# Patient Record
Sex: Male | Born: 1995 | Hispanic: No | Marital: Married | Smoking: Light tobacco smoker
Health system: Southern US, Community
[De-identification: ages and names within clinical notes are randomized; demographics above are authoritative.]

## PROBLEM LIST (undated history)

## (undated) DIAGNOSIS — H547 Unspecified visual loss: Secondary | ICD-10-CM

## (undated) HISTORY — DX: Unspecified visual loss: H54.7

## (undated) HISTORY — PX: APPENDECTOMY (OPEN): SHX54

## (undated) HISTORY — PX: APPENDECTOMY: SHX54

---

## 2014-08-24 ENCOUNTER — Emergency Department: Payer: Self-pay

## 2014-08-24 ENCOUNTER — Emergency Department
Admission: EM | Admit: 2014-08-24 | Discharge: 2014-08-24 | Disposition: A | Discharge status: Home / Self Care | Payer: Self-pay | Attending: Emergency Medicine | Admitting: Emergency Medicine

## 2014-08-24 DIAGNOSIS — Y92410 Unspecified street and highway as the place of occurrence of the external cause: Secondary | ICD-10-CM | POA: Insufficient documentation

## 2014-08-24 DIAGNOSIS — S61412A Laceration without foreign body of left hand, initial encounter: Secondary | ICD-10-CM | POA: Insufficient documentation

## 2014-08-24 DIAGNOSIS — S62341A Nondisplaced fracture of base of second metacarpal bone. left hand, initial encounter for closed fracture: Secondary | ICD-10-CM | POA: Insufficient documentation

## 2014-08-24 MED ORDER — HYDROCODONE-ACETAMINOPHEN 5-325 MG PO TABS
1.0000 | ORAL_TABLET | Freq: Four times a day (QID) | ORAL | Status: DC | PRN
Start: 2014-08-24 — End: 2018-02-11
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
## 2014-08-24 NOTE — Discharge Instructions (Signed)
Dear Terry Fitzpatrick,    You were seen today by Weyman Rodney, PA-C. Thank you for choosing the [facility] for your healthcare needs.  We hope your visit today was EXCELLENT.    Rest and ice your injury.  Keep splint in place.  Take ibuprofen (over the counter) as needed for pain.  For more severe pain you can take Norco.  Do not take when working or driving.  Follow-up with Dr. Lorin Picket (orthopedist) tomorrow or Monday.  Return to the emergency department for any new or worsening symptoms.    Please take any medications prescribed as directed.     If you have any questions or concerns, I am available at [phone]. Please do not hesitate to contact me if I can be of assistance.     Below is some information and resources that our patients often find helpful.    Sincerely,    Weyman Rodney, Physician Assistant  [facility]    ________________________________________________________________  Thank you for choosing [facility] for your emergency care needs.  We strive to provide EXCELLENT care to you and your family.      If you do not continue to improve or your condition worsens, please contact your doctor or return immediately to the Emergency Department.    DOCTOR REFERRALS  Call [phone] (available 24 hours a day, 7 days a week) if you need any further referrals and we can help you find a primary care doctor or specialist.  Also, available online at:  [email or web address]    YOUR CONTACT INFORMATION  Before leaving please check with registration to make sure we have an up-to-date contact number.  You can call registration at [phone] to update your information.  For questions about your hospital bill, please call [phone].  For questions about your Emergency Dept Physician bill please call [phone].      FREE HEALTH SERVICES  If you need help with health or social services, please call 2-1-1 for a  free referral to resources in your area.  2-1-1 is a free service connecting people with information on health insurance, free clinics, pregnancy, mental health, dental care, food assistance, housing, and substance abuse counseling.  Also, available online at:  http://www.[facility].org    MEDICAL RECORDS AND TESTS  Certain laboratory test results do not come back the same day, for example urine cultures.  We will contact you if other important findings are noted. Radiology films are often reviewed again to ensure accuracy.  If there is any discrepancy, we will notify you.    Please call [phone] to pick up a complimentary CD of any radiology studies performed.  If you or your doctor would like to request a copy of your medical records, please call [phone].      ORTHOPEDIC INJURY   Please know that significant injuries can exist even when an initial x-ray is read as normal or negative.  This can occur because some fractures (broken bones) are not initially visible on x-rays.  For this reason, close outpatient follow-up with your primary care doctor or bone specialist (orthopedist) is required.    MEDICATIONS AND FOLLOWUP  Please be aware that some prescription medications can cause drowsiness.  Use caution when driving or operating machinery.    The examination and treatment you have received in our Emergency Department is provided on an emergency basis, and is not intended to be a substitute for your  primary care physician.  It is important that your doctor checks you again and that you report any new or remaining problems at that time.      [place], [place], [place] [address] (1.4 miles, 7 minutes)  [place], [place], [place] [address] (6.5 miles, 13 minutes)  Handout with directions available on request.

## 2021-04-01 ENCOUNTER — Other Ambulatory Visit: Payer: Self-pay

## 2021-04-01 ENCOUNTER — Emergency Department (INDEPENDENT_AMBULATORY_CARE_PROVIDER_SITE_OTHER): Payer: 59

## 2021-04-01 ENCOUNTER — Emergency Department
Admission: EM | Admit: 2021-04-01 | Discharge: 2021-04-01 | Disposition: A | Discharge status: Home / Self Care | Payer: 59 | Source: Home / Self Care

## 2021-04-01 DIAGNOSIS — M25512 Pain in left shoulder: Secondary | ICD-10-CM

## 2021-04-01 DIAGNOSIS — M62838 Other muscle spasm: Secondary | ICD-10-CM

## 2021-04-01 DIAGNOSIS — S46912A Strain of unspecified muscle, fascia and tendon at shoulder and upper arm level, left arm, initial encounter: Secondary | ICD-10-CM

## 2021-04-01 MED ORDER — METHYLPREDNISOLONE 4 MG PO TBPK: ORAL_TABLET | ORAL | 0 refills | Status: AC

## 2021-04-01 MED ORDER — BACLOFEN 10 MG PO TABS: 10.0000 mg | ORAL_TABLET | Freq: Three times a day (TID) | ORAL | 0 refills | Status: AC

## 2021-04-01 MED ORDER — METHYLPREDNISOLONE ACETATE 80 MG/ML IJ SUSP
80.0000 mg | Freq: Once | INTRAMUSCULAR | Status: AC
  Administered 2021-04-01: 80 mg via INTRAMUSCULAR

## 2021-04-01 NOTE — ED Provider Notes (Signed)
[facility]    CSN: [id number] Arrival date & time: 04/01/21  1048      History   Chief Complaint Chief Complaint  Patient presents with  . Shoulder Pain    HPI Ronald Williamson is a 25 y.o. male.   HPI 25 year old male presents with left shoulder pain for 1 month.  Patient reports he typically drives with left arm is noticed increased pain switch to right arm.  Denies injury or insult to left shoulder.  History reviewed. No pertinent past medical history.  There are no problems to display for this patient.   Past Surgical History:  Procedure Laterality Date  . APPENDECTOMY         Home Medications    Prior to Admission medications   Medication Sig Start Date End Date Taking? Authorizing Provider  baclofen (LIORESAL) 10 MG tablet Take 1 tablet (10 mg total) by mouth 3 (three) times daily. 04/01/21  Yes Trevor Iha, FNP  methylPREDNISolone (MEDROL DOSEPAK) 4 MG TBPK tablet Take as directed 04/01/21  Yes Trevor Iha, FNP    Family History Family History  Problem Relation Age of Onset  . Healthy Mother   . Healthy Father     Social History Social History   Tobacco Use  . Smoking status: Light Tobacco Smoker    Types: Pipe, Cigars  . Smokeless tobacco: Never Used  Vaping Use  . Vaping Use: Some days  . Substances: Nicotine  Substance Use Topics  . Alcohol use: Yes    Comment: occ  . Drug use: Yes     Allergies   Patient has no allergy information on record.   Review of Systems Review of Systems  Constitutional: Negative.   HENT: Negative.   Eyes: Negative.   Respiratory: Negative.   Cardiovascular: Negative.   Gastrointestinal: Negative.   Genitourinary: Negative.   Musculoskeletal:       Shoulder pain x 1 month  Skin: Negative.   Neurological: Negative.      Physical Exam Triage Vital Signs ED Triage Vitals  Enc Vitals Group     BP 04/01/21 1105 132/88     Pulse Rate 04/01/21 1105 61     Resp 04/01/21 1105 15      Temp 04/01/21 1105 98 F (36.7 C)     Temp Source 04/01/21 1105 Oral     SpO2 04/01/21 1105 98 %     Weight 04/01/21 1102 136 lb 11 oz (62 kg)     Height 04/01/21 1102 5\' 5"  (1.651 m)     Head Circumference --      Peak Flow --      Pain Score 04/01/21 1102 9     Pain Loc --      Pain Edu? --      Excl. in GC? --    No data found.  Updated Vital Signs BP 132/88 (BP Location: Right Arm)   Pulse 61   Temp 98 F (36.7 C) (Oral)   Resp 15   Ht 5\' 5"  (1.651 m)   Wt 136 lb 11 oz (62 kg)   SpO2 98%   BMI 22.75 kg/m     Physical Exam Vitals and nursing note reviewed.  Constitutional:      General: He is not in acute distress.    Appearance: Normal appearance. He is normal weight. He is not ill-appearing.  HENT:     Head: Normocephalic and atraumatic.     Mouth/Throat:     Mouth: Mucous  membranes are moist.     Pharynx: Oropharynx is clear.  Eyes:     Extraocular Movements: Extraocular movements intact.     Conjunctiva/sclera: Conjunctivae normal.     Pupils: Pupils are equal, round, and reactive to light.  Cardiovascular:     Rate and Rhythm: Normal rate and regular rhythm.     Pulses: Normal pulses.     Heart sounds: Normal heart sounds.  Pulmonary:     Effort: Pulmonary effort is normal.     Breath sounds: Normal breath sounds.     Comments: No adventitious breath sounds noted Musculoskeletal:     Cervical back: Normal range of motion and neck supple. No tenderness.     Comments: Left shoulder (anterior/medical aspects): Non-TTP over GH and AC joints, limited range of motion with flexion/extension, scapular retraction (with mild pain elicited), internal/external rotation, and horizontal abduction/abduction (with mild pain elicited), no deformity noted  Lymphadenopathy:     Cervical: No cervical adenopathy.  Skin:    General: Skin is warm and dry.  Neurological:     General: No focal deficit present.     Mental Status: He is alert and oriented to person, place,  and time.  Psychiatric:        Mood and Affect: Mood normal.        Behavior: Behavior normal.      UC Treatments / Results  Labs (all labs ordered are listed, but only abnormal results are displayed) Labs Reviewed - No data to display  EKG   Radiology DG Shoulder Left  Result Date: 04/01/2021 CLINICAL DATA:  Left shoulder pain for 1 month.  No known injury. EXAM: LEFT SHOULDER - 2+ VIEW COMPARISON:  None. FINDINGS: There is no evidence of fracture or dislocation. There is no evidence of arthropathy or other focal bone abnormality. Soft tissues are unremarkable. IMPRESSION: Normal exam. Electronically Signed   By: Drusilla Kanner M.D.   On: 04/01/2021 12:35    Procedures Procedures (including critical care time)  Medications Ordered in UC Medications  methylPREDNISolone acetate (DEPO-MEDROL) injection 80 mg (80 mg Intramuscular Given 04/01/21 1214)    Initial Impression / Assessment and Plan / UC Course  I have reviewed the triage vital signs and the nursing notes.  Pertinent labs & imaging results that were available during my care of the patient were reviewed by me and considered in my medical decision making (see chart for details).    MDM: 1.  Left shoulder pain, 2.  Left shoulder strain, 3.  Left Trapezius spasm. Final Clinical Impressions(s) / UC Diagnoses   Final diagnoses:  Strain of left shoulder, initial encounter  Acute pain of left shoulder  Trapezius muscle spasm     Discharge Instructions     Advised/instructed patient to take medication as directed with food to completion.  Encourage patient increase daily water intake while taking this medication.  Encourage patient to avoid repetitive overuse activities involving affected area of left shoulder and left-sided posterior neck.  Advised patient may use Baclofen daily, or as needed    ED Prescriptions    Medication Sig Dispense Auth. Provider   methylPREDNISolone (MEDROL DOSEPAK) 4 MG TBPK tablet Take  as directed 1 each Trevor Iha, FNP   baclofen (LIORESAL) 10 MG tablet Take 1 tablet (10 mg total) by mouth 3 (three) times daily. 30 each Trevor Iha, FNP     PDMP not reviewed this encounter.   Trevor Iha, FNP 04/01/21 1313

## 2021-04-01 NOTE — Discharge Instructions (Addendum)
Advised/instructed patient to take medication as directed with food to completion.  Encourage patient increase daily water intake while taking this medication.  Encourage patient to avoid repetitive overuse activities involving affected area of left shoulder and left-sided posterior neck.  Advised patient may use Baclofen daily, or as needed

## 2021-04-01 NOTE — ED Triage Notes (Signed)
Pt presents with L shoulder pain for 1 month. Pt st he typically drives with his L arm and noticed increased pain so has switched to his Right arm. Pt st he has been noticing pain when performing ADLS pt takes otc pain medication with some relief.  Pt declines hurting it.

## 2021-04-08 ENCOUNTER — Ambulatory Visit (INDEPENDENT_AMBULATORY_CARE_PROVIDER_SITE_OTHER): Payer: 59 | Admitting: Sports Medicine

## 2021-04-08 ENCOUNTER — Other Ambulatory Visit: Payer: Self-pay

## 2021-04-08 DIAGNOSIS — M7542 Impingement syndrome of left shoulder: Secondary | ICD-10-CM | POA: Diagnosis not present

## 2021-04-08 NOTE — Assessment & Plan Note (Signed)
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 For the past month he had pain over his deltoid, worse with overhead activities. Terrible in the mornings. He was seen in urgent care, referred to me for further evaluation. On exam he only has minimal impingement signs, good strength, good motion. I think we can treat him conservatively, continue NSAIDs, adding aggressive formal physical therapy, return to see me in 4 to 6 weeks, subacromial injection if no better.

## 2021-04-08 NOTE — Progress Notes (Signed)
    Procedures performed today:    None.  Independent interpretation of notes and tests performed by another provider:   X-rays personally reviewed and are unremarkable.  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 For the past month he had pain over his deltoid, worse with overhead activities. Terrible in the mornings. He was seen in urgent care, referred to me for further evaluation. On exam he only has minimal impingement signs, good strength, good motion. I think we can treat him conservatively, continue NSAIDs, adding aggressive formal physical therapy, return to see me in 4 to 6 weeks, subacromial injection if no better.    ___________________________________________ Ihor Austin. Benjamin Stain, M.D., ABFM., CAQSM. Primary Care and Sports Medicine [facility]  Adjunct Instructor of Family Medicine  [employer]

## 2021-04-09 ENCOUNTER — Encounter: Payer: Self-pay | Admitting: Physical Therapy

## 2021-04-09 ENCOUNTER — Ambulatory Visit (INDEPENDENT_AMBULATORY_CARE_PROVIDER_SITE_OTHER): Payer: 59 | Admitting: Physical Therapy

## 2021-04-09 DIAGNOSIS — R293 Abnormal posture: Secondary | ICD-10-CM

## 2021-04-09 DIAGNOSIS — M6281 Muscle weakness (generalized): Secondary | ICD-10-CM | POA: Diagnosis not present

## 2021-04-09 DIAGNOSIS — M25512 Pain in left shoulder: Secondary | ICD-10-CM | POA: Diagnosis not present

## 2021-04-09 NOTE — Patient Instructions (Signed)
Access Code: [email or web address] URL: [email or web address] Date: 04/09/2021 Prepared by: Reggy Eye  Exercises Doorway Pec Stretch at 90 Degrees Abduction - 1 x daily - 7 x weekly - 3 sets - 1 reps - 20-30 seconds hold Supine Chest Stretch with Elbows Bent - 1 x daily - 7 x weekly - 3 sets - 1 reps - 20-30 seconds hold Supine Chest Stretch on Foam Roll - 1 x daily - 7 x weekly - 3 sets - 1 reps - 20-30 seconds hold Standing Serratus Punch with Resistance - 1 x daily - 7 x weekly - 3 sets - 10 reps Standing Bilateral Low Shoulder Row with Anchored Resistance - 1 x daily - 7 x weekly - 3 sets - 10 reps Shoulder extension with resistance - Neutral - 1 x daily - 7 x weekly - 3 sets - 10 reps Shoulder External Rotation and Scapular Retraction with Resistance - 1 x daily - 7 x weekly - 3 sets - 10 reps Scaption with Dumbbells - 1 x daily - 7 x weekly - 3 sets - 10 reps

## 2021-04-09 NOTE — Therapy (Signed)
[facility] [place] [address] [place], [place], [address] Phone: [phone]   Fax:  [phone]  Physical Therapy Evaluation  Patient Details  Name: Ronald Williamson MRN: [id number] Date of Birth: [birth year] Referring Provider (PT): [email or web address]   Encounter Date: 04/09/2021   PT End of Session - 04/09/21 0838     Visit Number 1    Number of Visits 1    Date for PT Re-Evaluation 04/09/21    PT Start Time 0800    PT Stop Time 0834    PT Time Calculation (min) 34 min    Activity Tolerance Patient tolerated treatment well    Behavior During Therapy [facility] for tasks assessed/performed             History reviewed. No pertinent past medical history.  Past Surgical History:  Procedure Laterality Date   APPENDECTOMY      There were no vitals filed for this visit.    Subjective Assessment - 04/09/21 0804     Subjective Pt states he has had Lt shoulder pain x 1 month, insiduous onset. Pt states his pain is worse in the morning and is helped by meds. Pain is not constant but is there "most of the time". Pt is right handed but usually uses his Lt hand to drive, pt has switched to using his right hand.    Patient Stated Goals decrease pain    Currently in Pain? Yes    Pain Score 2     Pain Location Shoulder    Pain Orientation Left    Pain Descriptors / Indicators Aching    Pain Type Acute pain    Pain Radiating Towards elbow    Pain Onset More than a month ago    Pain Frequency Intermittent    Aggravating Factors  unknown    Pain Relieving Factors meds                [facility] PT Assessment - 04/09/21 0001       Assessment   Medical Diagnosis impingement syndrome left shoulder    Referring Provider (PT) [email or web address]    Hand Dominance Right      Balance Screen   Has the patient fallen in the past 6 months No      Prior Function   Level of Independence Independent      Posture/Postural Control   Posture  Comments rounded shoulders      ROM / Strength   AROM / PROM / Strength Strength;AROM      AROM   Overall AROM Comments bilat shoulder WFL      Strength   Overall Strength Comments bilat shoulder 5/5 flexion and abduction, Lt scaption 3+/5, pain with resisted IR Lt 5/5      Palpation   Palpation comment shoulder jt mobility WFL, TTP Lt pec major and anterior deltoid      Special Tests    Special Tests Laxity/Instability Tests;Biceps/Labral Tests    Other special tests empty can negative left    Biceps/Labral tests Speeds Test      Speeds test   findings Negative    Side Left                        Objective measurements completed on examination: See above findings.       [facility] Adult PT Treatment/Exercise - 04/09/21 0001       Exercises   Exercises Shoulder  Shoulder Exercises: Standing   Extension 20 reps    Theraband Level (Shoulder Extension) Level 4 (Blue)    Row 20 reps    Theraband Level (Shoulder Row) Level 4 (Blue)    Other Standing Exercises Ws blue TB x 10, serratus punch blue TB x 10      Shoulder Exercises: Stretch   Star Gazer Stretch 2 reps;20 seconds    Other Shoulder Stretches doorway stretch all 3 positions x 20 sec    Other Shoulder Stretches supine lying over foam roll x 30 sec                    PT Education - 04/09/21 0838     Education Details PT POC, HEP    Person(s) Educated Patient    Methods Explanation;Demonstration;Handout    Comprehension Verbalized understanding;Returned demonstration                         Plan - 04/09/21 0839     Clinical Impression Statement Pt is a 25 y/o male who presents with c/o Lt shoulder pain of insiduous onset. Pt presents with impaired posture, strength and flexibility and was given HEP to address deficits. Pt with good understanding and performance of HEP. No further PT needs identified at this time.    Personal Factors and Comorbidities Time since onset  of injury/illness/exacerbation    Examination-Participation Restrictions Driving    Stability/Clinical Decision Making Stable/Uncomplicated    Rehab Potential Good    PT Frequency One time visit    PT Treatment/Interventions Therapeutic exercise    PT Next Visit Plan eval only    PT Home Exercise Plan [id number]    Consulted and Agree with Plan of Care Patient             Patient will benefit from skilled therapeutic intervention in order to improve the following deficits and impairments:  Pain, Postural dysfunction, Impaired flexibility, Decreased strength  Visit Diagnosis: Muscle weakness (generalized) - Plan: PT plan of care cert/re-cert  Abnormal posture - Plan: PT plan of care cert/re-cert  Acute pain of left shoulder - Plan: PT plan of care cert/re-cert     Problem List Patient Active Problem List   Diagnosis Date Noted   Impingement syndrome, shoulder, left 04/08/2021   Ronald Williamson, PT  Ronald Williamson 04/09/2021, 8:44 AM  [facility] [address] [place], [place], [address] Phone: [phone]   Fax:  [phone]  Name: Ronald Williamson MRN: [id number] Date of Birth: 11/01/1995

## 2021-05-06 ENCOUNTER — Ambulatory Visit: Payer: 59 | Admitting: Sports Medicine

## 2022-02-03 IMAGING — DX DG SHOULDER 2+V*L*
3 series · 3 of 3 positions shown · non-contrast
Comparison: None.

CLINICAL DATA: Left shoulder pain for 1 month.  No known injury.

EXAM:
LEFT SHOULDER - 2+ VIEW

[shoulder grashey]
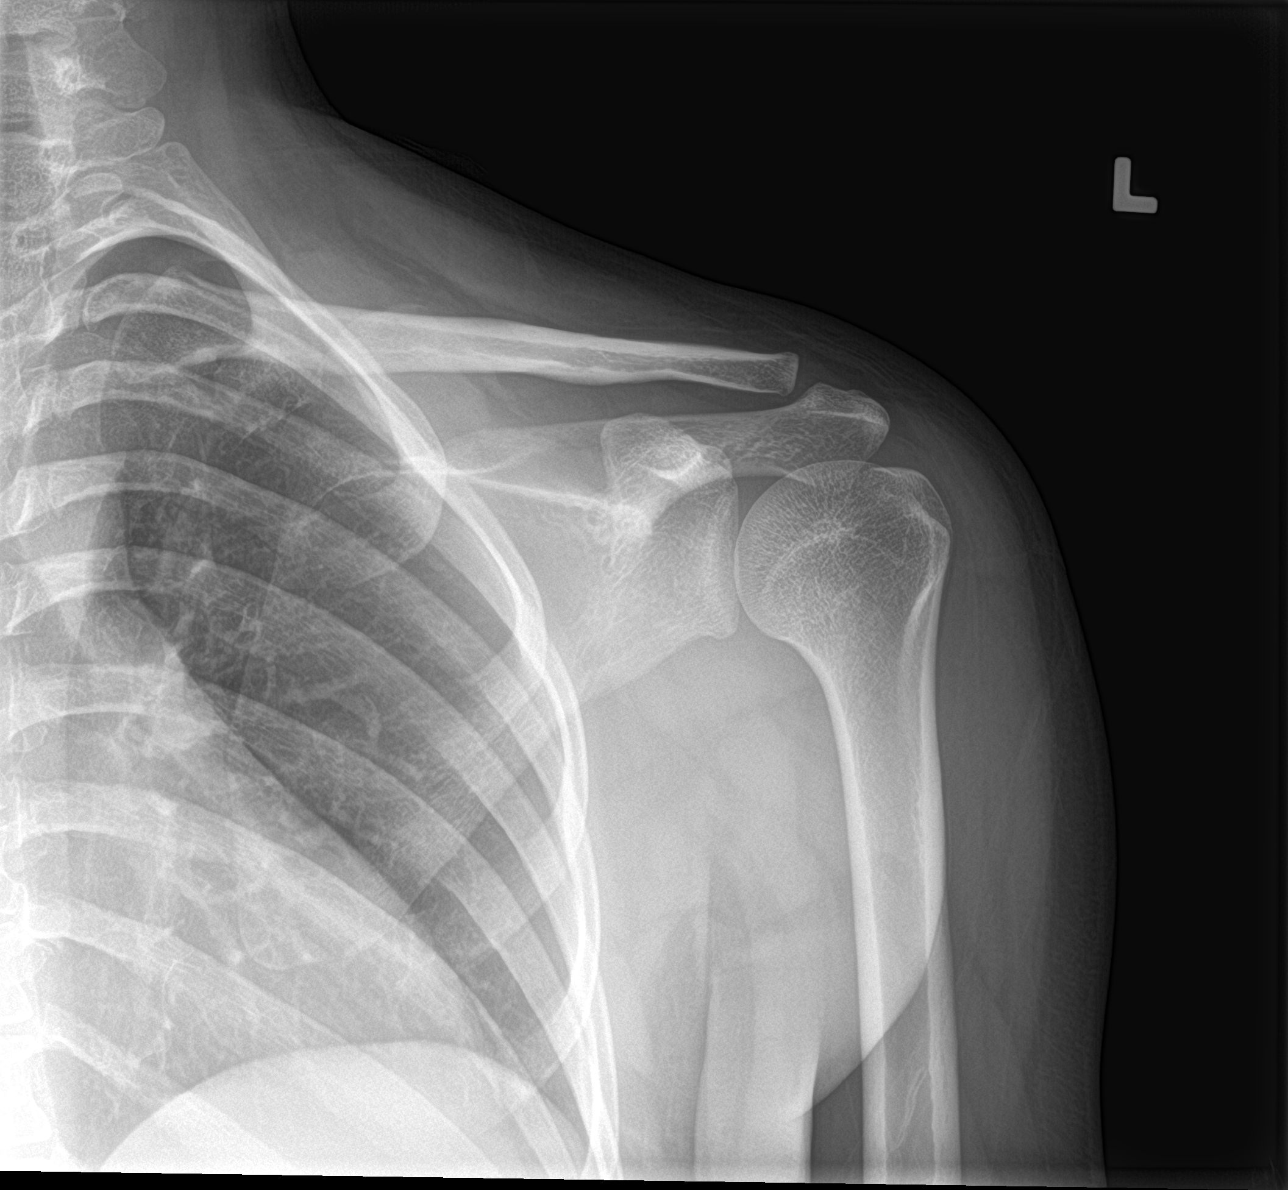

[shoulder y view]
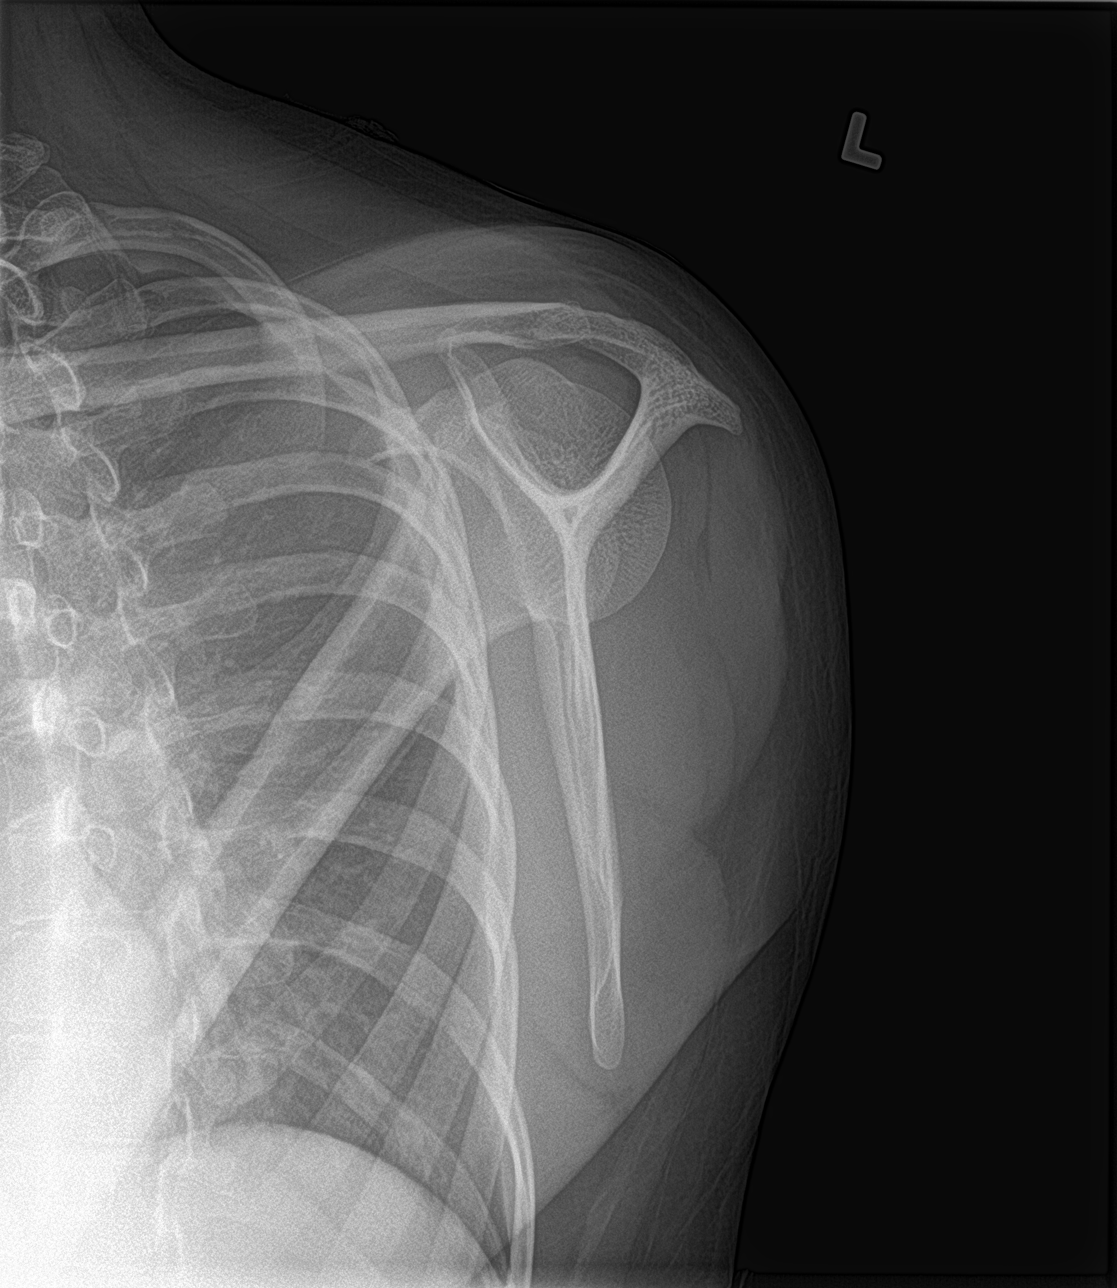

[shoulder axillary]
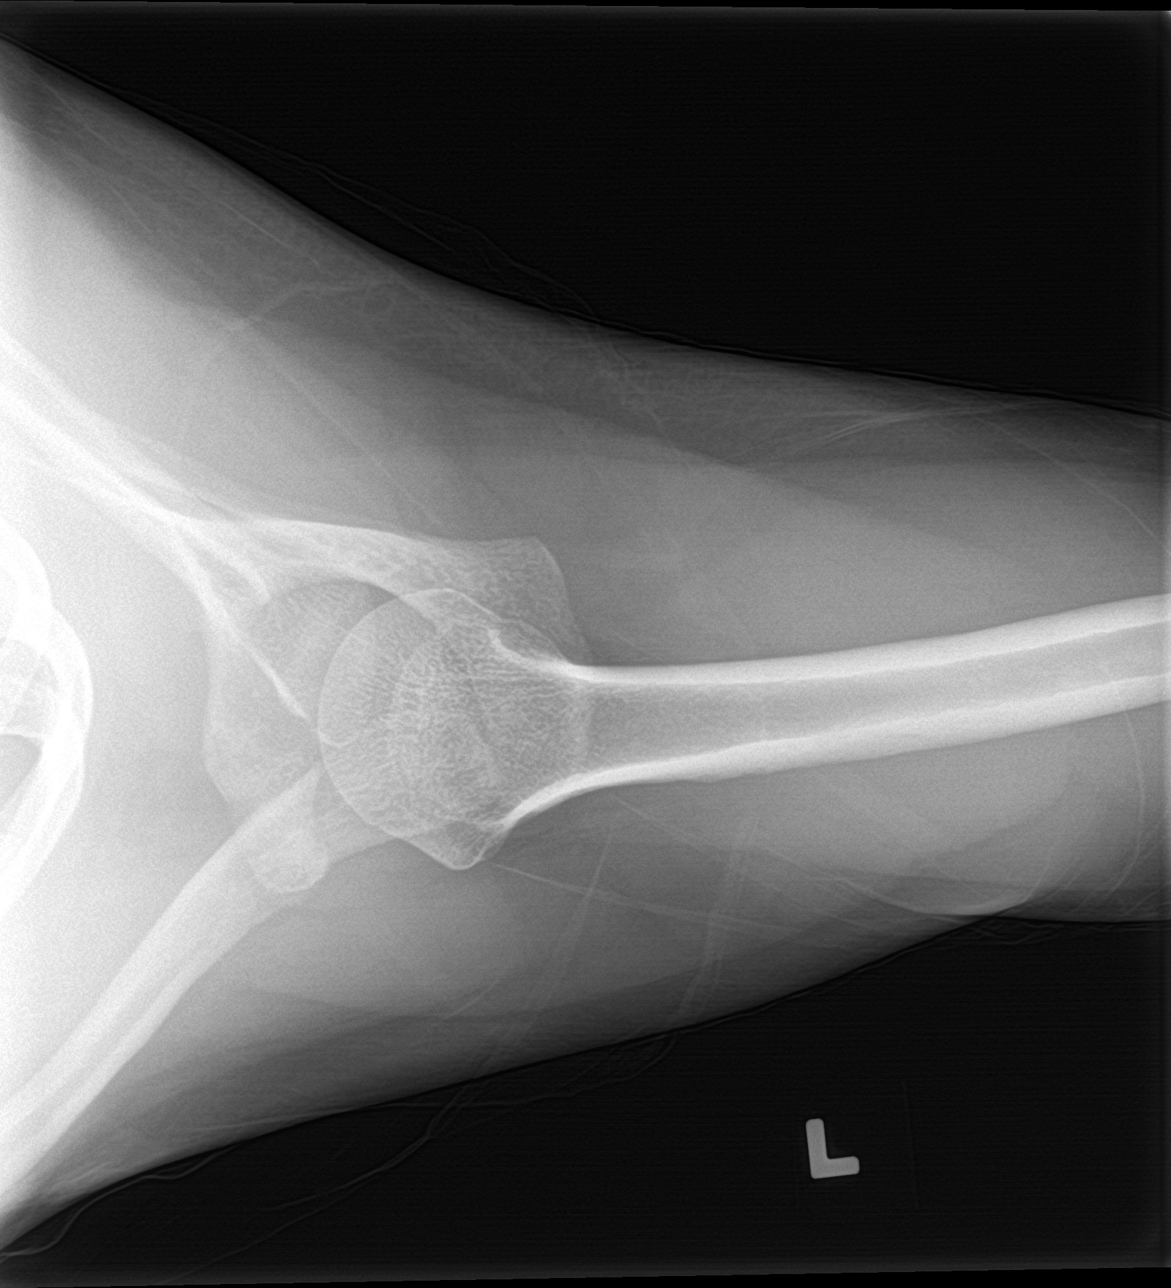

[3 of 3 positions shown; findings below may reference images not displayed]

FINDINGS: There is no evidence of fracture or dislocation. There is no
evidence of arthropathy or other focal bone abnormality. Soft
tissues are unremarkable.
IMPRESSION: Normal exam.
